# Patient Record
Sex: Male | Born: 1990 | Race: Black or African American | Hispanic: No | Marital: Single | Smoking: Current every day smoker
Health system: Southern US, Community
[De-identification: ages and names within clinical notes are randomized; demographics above are authoritative.]

## PROBLEM LIST (undated history)

## (undated) ENCOUNTER — Emergency Department (HOSPITAL_BASED_OUTPATIENT_CLINIC_OR_DEPARTMENT_OTHER): Admission: EM | Payer: Self-pay | Source: Home / Self Care

---

## 2008-02-11 ENCOUNTER — Emergency Department (HOSPITAL_BASED_OUTPATIENT_CLINIC_OR_DEPARTMENT_OTHER): Admission: EM | Admit: 2008-02-11 | Discharge: 2008-02-11 | Payer: Self-pay | Admitting: Emergency Medicine

## 2009-08-15 ENCOUNTER — Ambulatory Visit: Payer: Self-pay | Admitting: Diagnostic Radiology

## 2009-08-15 ENCOUNTER — Emergency Department (HOSPITAL_BASED_OUTPATIENT_CLINIC_OR_DEPARTMENT_OTHER): Admission: EM | Admit: 2009-08-15 | Discharge: 2009-08-15 | Payer: Self-pay | Admitting: Emergency Medicine

## 2011-03-24 ENCOUNTER — Emergency Department (HOSPITAL_BASED_OUTPATIENT_CLINIC_OR_DEPARTMENT_OTHER)
Admission: EM | Admit: 2011-03-24 | Discharge: 2011-03-24 | Discharge status: Against Medical Advice | Payer: Self-pay | Attending: Emergency Medicine | Admitting: Emergency Medicine

## 2011-03-24 ENCOUNTER — Encounter: Payer: Self-pay | Admitting: *Deleted

## 2011-03-24 DIAGNOSIS — H5710 Ocular pain, unspecified eye: Secondary | ICD-10-CM | POA: Insufficient documentation

## 2011-03-24 NOTE — ED Notes (Signed)
Right eye red and irritated since thursday

## 2011-03-24 NOTE — ED Notes (Signed)
Per registration pt was witnessed walking out of ED- did not inform staff he was leaving

## 2011-03-25 ENCOUNTER — Emergency Department (HOSPITAL_BASED_OUTPATIENT_CLINIC_OR_DEPARTMENT_OTHER)
Admission: EM | Admit: 2011-03-25 | Discharge: 2011-03-25 | Disposition: A | Discharge status: Home / Self Care | Payer: Self-pay | Attending: Emergency Medicine | Admitting: Emergency Medicine

## 2011-03-25 ENCOUNTER — Encounter (HOSPITAL_BASED_OUTPATIENT_CLINIC_OR_DEPARTMENT_OTHER): Payer: Self-pay | Admitting: Emergency Medicine

## 2011-03-25 DIAGNOSIS — J45909 Unspecified asthma, uncomplicated: Secondary | ICD-10-CM | POA: Insufficient documentation

## 2011-03-25 DIAGNOSIS — J069 Acute upper respiratory infection, unspecified: Secondary | ICD-10-CM | POA: Insufficient documentation

## 2011-03-25 DIAGNOSIS — F17200 Nicotine dependence, unspecified, uncomplicated: Secondary | ICD-10-CM | POA: Insufficient documentation

## 2011-03-25 DIAGNOSIS — H109 Unspecified conjunctivitis: Secondary | ICD-10-CM | POA: Insufficient documentation

## 2011-03-25 MED ORDER — AMOXICILLIN 500 MG PO CAPS: 500.0000 mg | ORAL_CAPSULE | Freq: Three times a day (TID) | ORAL | Status: AC

## 2011-03-25 MED ORDER — TOBRAMYCIN 0.3 % OP SOLN: 1.0000 [drp] | OPHTHALMIC | Status: AC

## 2011-03-25 MED ORDER — TETRACAINE HCL 0.5 % OP SOLN
1.0000 [drp] | Freq: Once | OPHTHALMIC | Status: DC
  Filled 2011-03-25: qty 2

## 2011-03-25 MED ORDER — FLUORESCEIN SODIUM 1 MG OP STRP
1.0000 | ORAL_STRIP | Freq: Once | OPHTHALMIC | Status: DC
  Filled 2011-03-25: qty 1

## 2011-03-25 NOTE — ED Provider Notes (Signed)
History     CSN: [id number] Arrival date & time: 03/25/2011 12:07 PM   First MD Initiated Contact with Patient 03/25/11 1213      Chief Complaint  Patient presents with  . Conjunctivitis    (Consider location/radiation/quality/duration/timing/severity/associated sxs/prior treatment) Patient is a 20 y.o. male presenting with conjunctivitis. The history is provided by the patient. A language interpreter was used.  Conjunctivitis  The current episode started yesterday. The problem occurs continuously. The problem has been gradually worsening. The problem is moderate. The symptoms are relieved by nothing. The symptoms are aggravated by nothing. Associated symptoms include decreased vision, double vision, eye itching, eye discharge and eye redness. Pertinent negatives include no muscle aches, no cough and no URI. The eye pain is moderate. The eye pain is associated with movement. The eyelid exhibits swelling.   Pt complains of a cough congestion and nasal drainage Past Medical History  Diagnosis Date  . Asthma     History reviewed. No pertinent past surgical history.  History reviewed. No pertinent family history.  History  Substance Use Topics  . Smoking status: Current Everyday Smoker    Types: Cigars  . Smokeless tobacco: Never Used  . Alcohol Use: 7.2 oz/week    12 Shots of liquor per week     occasional      Review of Systems  Eyes: Positive for double vision, discharge, redness and itching.  Respiratory: Negative for cough.   All other systems reviewed and are negative.    Allergies  Review of patient's allergies indicates no known allergies.  Home Medications  No current outpatient prescriptions on file.  BP 144/80  Pulse 76  Temp(Src) 98.6 F (37 C) (Oral)  Resp 18  Ht 6' (1.829 m)  Wt 172 lb (78.019 kg)  BMI 23.33 kg/m2  SpO2 99%  Physical Exam  Nursing note and vitals reviewed. Constitutional: He appears well-developed and well-nourished.  HENT:   Head: Normocephalic and atraumatic.  Right Ear: External ear normal.  Left Ear: External ear normal.  Eyes: Conjunctivae and EOM are normal. Pupils are equal, round, and reactive to light. Right eye exhibits discharge.  Neck: Normal range of motion. Neck supple.  Cardiovascular: Normal rate.   Pulmonary/Chest: Effort normal.  Abdominal: Soft.  Musculoskeletal: Normal range of motion.  Neurological: He is alert.  Skin: Skin is warm.  Psychiatric: He has a normal mood and affect.    ED Course  Procedures (including critical care time)  Labs Reviewed - No data to display No results found.   No diagnosis found.    MDM   Pt counseled on eye infections,  I will treat with amoxicillian and tobrex       Langston Masker, [place] 03/25/11 1257

## 2011-03-25 NOTE — ED Provider Notes (Signed)
Medical screening examination/treatment/procedure(s) were performed by non-physician practitioner and as supervising physician I was immediately available for consultation/collaboration.  Llewellyn Choplin K Linker, MD 03/25/11 1259 

## 2011-03-25 NOTE — ED Notes (Signed)
Right eye redness, drainage, pain x 3 days.  Pt having crust on eye in am.

## 2011-10-05 ENCOUNTER — Emergency Department (HOSPITAL_BASED_OUTPATIENT_CLINIC_OR_DEPARTMENT_OTHER)
Admission: EM | Admit: 2011-10-05 | Discharge: 2011-10-05 | Disposition: A | Discharge status: Home / Self Care | Payer: Self-pay | Attending: Emergency Medicine | Admitting: Emergency Medicine

## 2011-10-05 ENCOUNTER — Encounter (HOSPITAL_BASED_OUTPATIENT_CLINIC_OR_DEPARTMENT_OTHER): Payer: Self-pay | Admitting: *Deleted

## 2011-10-05 DIAGNOSIS — Z79899 Other long term (current) drug therapy: Secondary | ICD-10-CM | POA: Insufficient documentation

## 2011-10-05 DIAGNOSIS — J45909 Unspecified asthma, uncomplicated: Secondary | ICD-10-CM | POA: Insufficient documentation

## 2011-10-05 DIAGNOSIS — R509 Fever, unspecified: Secondary | ICD-10-CM | POA: Insufficient documentation

## 2011-10-05 DIAGNOSIS — R109 Unspecified abdominal pain: Secondary | ICD-10-CM | POA: Insufficient documentation

## 2011-10-05 DIAGNOSIS — R10817 Generalized abdominal tenderness: Secondary | ICD-10-CM | POA: Insufficient documentation

## 2011-10-05 DIAGNOSIS — F17200 Nicotine dependence, unspecified, uncomplicated: Secondary | ICD-10-CM | POA: Insufficient documentation

## 2011-10-05 DIAGNOSIS — R197 Diarrhea, unspecified: Secondary | ICD-10-CM | POA: Insufficient documentation

## 2011-10-05 DIAGNOSIS — R5381 Other malaise: Secondary | ICD-10-CM | POA: Insufficient documentation

## 2011-10-05 DIAGNOSIS — R112 Nausea with vomiting, unspecified: Secondary | ICD-10-CM

## 2011-10-05 LAB — BASIC METABOLIC PANEL WITH GFR
Creatinine, Ser: 1.1 mg/dL (ref 0.50–1.35)
Glucose, Bld: 83 mg/dL (ref 70–99)
Potassium: 3.6 meq/L (ref 3.5–5.1)

## 2011-10-05 LAB — BASIC METABOLIC PANEL
BUN: 13 mg/dL (ref 6–23)
CO2: 23 mEq/L (ref 19–32)
Calcium: 9.6 mg/dL (ref 8.4–10.5)
Chloride: 101 mEq/L (ref 96–112)
GFR calc Af Amer: >90 mL/min (ref >90)
GFR calc non Af Amer: >90 mL/min (ref >90)
Sodium: 139 mEq/L (ref 135–145)

## 2011-10-05 MED ORDER — SODIUM CHLORIDE 0.9 % IV BOLUS (SEPSIS)
1000.0000 mL | Freq: Once | INTRAVENOUS | Status: AC
  Administered 2011-10-05: 1000 mL via INTRAVENOUS

## 2011-10-05 MED ORDER — PROMETHAZINE HCL 25 MG PO TABS: 25.0000 mg | ORAL_TABLET | Freq: Four times a day (QID) | ORAL | Status: DC | PRN

## 2011-10-05 MED ORDER — FENTANYL CITRATE 0.05 MG/ML IJ SOLN
50.0000 ug | Freq: Once | INTRAMUSCULAR | Status: AC
  Administered 2011-10-05: 50 ug via INTRAVENOUS
  Filled 2011-10-05: qty 2

## 2011-10-05 MED ORDER — FAMOTIDINE IN NACL 20-0.9 MG/50ML-% IV SOLN
20.0000 mg | Freq: Once | INTRAVENOUS | Status: AC
  Administered 2011-10-05: 20 mg via INTRAVENOUS
  Filled 2011-10-05: qty 50

## 2011-10-05 MED ORDER — ONDANSETRON HCL 4 MG/2ML IJ SOLN
4.0000 mg | Freq: Once | INTRAMUSCULAR | Status: AC
  Administered 2011-10-05: 4 mg via INTRAVENOUS
  Filled 2011-10-05: qty 2

## 2011-10-05 MED ORDER — ONDANSETRON 8 MG PO TBDP: 8.0000 mg | ORAL_TABLET | Freq: Two times a day (BID) | ORAL | Status: AC | PRN

## 2011-10-05 NOTE — ED Notes (Signed)
Reports feeling weak and generally bad on Wednesday afternoon woke up thurs am vomited "greenish acid" ate subway at lunch kept down for 2 hours then vomited again x 2 last emesis last pm at 2200 started having diarrhea last night and reports liquid stools off and on since last pm is able to keep water down

## 2011-10-05 NOTE — ED Provider Notes (Signed)
History     CSN: [id number]  Arrival date & time 10/05/11  4098   First MD Initiated Contact with Patient 10/05/11 [phone]      Chief Complaint  Patient presents with  . Diarrhea    (Consider location/radiation/quality/duration/timing/severity/associated sxs/prior treatment) HPI Comments: Pt reports was very fatigued and had myalgias 2 days ago.  Slept all afternoon, got up had 1 episode of vomiting, continued to be tired with decreased appetite. He tried to eat some subway today, finally, but  got sick and had  abd crampy pain in middle of abdomen, non radiating.  No back pain, no rectal bleeding.  No sig PMH x asthma but not SOB, no coughing, no prior h/o abd surgeries.  Didn't take any meds PTA.  No bloody stools, no foreign travel or recent abx use.    Patient is a 21 y.o. male presenting with diarrhea. The history is provided by the patient.  Diarrhea The primary symptoms include fever, fatigue, abdominal pain, nausea, vomiting and diarrhea.  The illness is also significant for chills. The illness does not include constipation or back pain.    Past Medical History  Diagnosis Date  . Asthma     History reviewed. No pertinent past surgical history.  History reviewed. No pertinent family history.  History  Substance Use Topics  . Smoking status: Current Everyday Smoker    Types: Cigars  . Smokeless tobacco: Never Used  . Alcohol Use: 7.2 oz/week    12 Shots of liquor per week     occasional      Review of Systems  Constitutional: Positive for fever, chills, appetite change and fatigue.  Gastrointestinal: Positive for nausea, vomiting, abdominal pain and diarrhea. Negative for constipation, blood in stool and rectal pain.  Musculoskeletal: Negative for back pain.  Neurological: Positive for weakness. Negative for light-headedness, numbness and headaches.  All other systems reviewed and are negative.    Allergies  Review of patient's allergies indicates no known  allergies.  Home Medications   Current Outpatient Rx  Name Route Sig Dispense Refill  . IPRATROPIUM-ALBUTEROL 18-103 MCG/ACT IN AERO Inhalation Inhale 2 puffs into the lungs every 6 (six) hours as needed.    Marland Kitchen ONDANSETRON 8 MG PO TBDP Oral Take 1 tablet (8 mg total) by mouth every 12 (twelve) hours as needed for nausea. 20 tablet 0  . PROMETHAZINE HCL 25 MG PO TABS Oral Take 1 tablet (25 mg total) by mouth every 6 (six) hours as needed for nausea. 20 tablet 0    BP 141/71  Pulse 83  Temp(Src) 98.2 F (36.8 C) (Oral)  Resp 18  Ht 6' (1.829 m)  Wt 173 lb (78.472 kg)  BMI 23.46 kg/m2  SpO2 100%  Physical Exam  Nursing note and vitals reviewed. Constitutional: He appears well-developed and well-nourished. No distress.  HENT:  Head: Normocephalic.  Mouth/Throat: Uvula is midline. Mucous membranes are not pale, dry and not cyanotic.  Eyes: No scleral icterus.  Cardiovascular: Normal rate.   Abdominal: Soft. Normal appearance. He exhibits no distension. There is generalized tenderness. There is no rebound, no guarding, no CVA tenderness, no tenderness at McBurney's point and negative Murphy's sign.  Musculoskeletal: He exhibits no edema and no tenderness.  Neurological: He is alert. Coordination normal.  Skin: Skin is warm and dry. He is not diaphoretic.  Psychiatric: He has a normal mood and affect.    ED Course  Procedures (including critical care time)   Labs Reviewed  BASIC METABOLIC PANEL  No results found.   1. Nausea vomiting and diarrhea       MDM  Pt felt much improved after IVF's, and IV meds, now tolerating oral fluids.  Pt safe to be discharged, probably mild gastroenteritis.  Pt told to try imodium at home.          Gavin Pound. Natale Thoma, MD 10/05/11 4098

## 2011-10-05 NOTE — Discharge Instructions (Signed)
Nausea and Vomiting  Nausea is a sick feeling that often comes before throwing up (vomiting). Vomiting is a reflex where stomach contents come out of your mouth. Vomiting can cause severe loss of body fluids (dehydration). Children and elderly adults can become dehydrated quickly, especially if they also have diarrhea. Nausea and vomiting are symptoms of a condition or disease. It is important to find the cause of your symptoms.  CAUSES    Direct irritation of the stomach lining. This irritation can result from increased acid production (gastroesophageal reflux disease), infection, food poisoning, taking certain medicines (such as nonsteroidal anti-inflammatory drugs), alcohol use, or tobacco use.   Signals from the brain.These signals could be caused by a headache, heat exposure, an inner ear disturbance, increased pressure in the brain from injury, infection, a tumor, or a concussion, pain, emotional stimulus, or metabolic problems.   An obstruction in the gastrointestinal tract (bowel obstruction).   Illnesses such as diabetes, hepatitis, gallbladder problems, appendicitis, kidney problems, cancer, sepsis, atypical symptoms of a heart attack, or eating disorders.   Medical treatments such as chemotherapy and radiation.   Receiving medicine that makes you sleep (general anesthetic) during surgery.  DIAGNOSIS  Your caregiver may ask for tests to be done if the problems do not improve after a few days. Tests may also be done if symptoms are severe or if the reason for the nausea and vomiting is not clear. Tests may include:   Urine tests.   Blood tests.   Stool tests.   Cultures (to look for evidence of infection).   X-rays or other imaging studies.  Test results can help your caregiver make decisions about treatment or the need for additional tests.  TREATMENT  You need to stay well hydrated. Drink frequently but in small amounts.You may wish to drink water, sports drinks, clear broth, or eat frozen  ice pops or gelatin dessert to help stay hydrated.When you eat, eating slowly may help prevent nausea.There are also some antinausea medicines that may help prevent nausea.  HOME CARE INSTRUCTIONS    Take all medicine as directed by your caregiver.   If you do not have an appetite, do not force yourself to eat. However, you must continue to drink fluids.   If you have an appetite, eat a normal diet unless your caregiver tells you differently.   Eat a variety of complex carbohydrates (rice, wheat, potatoes, bread), lean meats, yogurt, fruits, and vegetables.   Avoid high-fat foods because they are more difficult to digest.   Drink enough water and fluids to keep your urine clear or pale yellow.   If you are dehydrated, ask your caregiver for specific rehydration instructions. Signs of dehydration may include:   Severe thirst.   Dry lips and mouth.   Dizziness.   Dark urine.   Decreasing urine frequency and amount.   Confusion.   Rapid breathing or pulse.  SEEK IMMEDIATE MEDICAL CARE IF:    You have blood or brown flecks (like coffee grounds) in your vomit.   You have black or bloody stools.   You have a severe headache or stiff neck.   You are confused.   You have severe abdominal pain.   You have chest pain or trouble breathing.   You do not urinate at least once every 8 hours.   You develop cold or clammy skin.   You continue to vomit for longer than 24 to 48 hours.   You have a fever.  MAKE SURE YOU:      Understand these instructions.   Will watch your condition.   Will get help right away if you are not doing well or get worse.  Document Released: 05/14/2005 Document Revised: 05/03/2011 Document Reviewed: 10/11/2010  [employer] Patient Information 2012 [employer].

## 2011-10-05 NOTE — ED Notes (Signed)
Patient is resting comfortably. 

## 2011-10-05 NOTE — ED Notes (Signed)
MD at bedside. 

## 2013-07-30 ENCOUNTER — Encounter (HOSPITAL_BASED_OUTPATIENT_CLINIC_OR_DEPARTMENT_OTHER): Payer: Self-pay | Admitting: Emergency Medicine

## 2013-07-30 ENCOUNTER — Emergency Department (HOSPITAL_BASED_OUTPATIENT_CLINIC_OR_DEPARTMENT_OTHER)
Admission: EM | Admit: 2013-07-30 | Discharge: 2013-07-30 | Disposition: A | Discharge status: Home / Self Care | Payer: Self-pay | Attending: Emergency Medicine | Admitting: Emergency Medicine

## 2013-07-30 DIAGNOSIS — R1030 Lower abdominal pain, unspecified: Secondary | ICD-10-CM

## 2013-07-30 DIAGNOSIS — R109 Unspecified abdominal pain: Secondary | ICD-10-CM | POA: Insufficient documentation

## 2013-07-30 DIAGNOSIS — Z79899 Other long term (current) drug therapy: Secondary | ICD-10-CM | POA: Insufficient documentation

## 2013-07-30 DIAGNOSIS — F17200 Nicotine dependence, unspecified, uncomplicated: Secondary | ICD-10-CM | POA: Insufficient documentation

## 2013-07-30 DIAGNOSIS — J45909 Unspecified asthma, uncomplicated: Secondary | ICD-10-CM | POA: Insufficient documentation

## 2013-07-30 DIAGNOSIS — L989 Disorder of the skin and subcutaneous tissue, unspecified: Secondary | ICD-10-CM | POA: Insufficient documentation

## 2013-07-30 LAB — URINALYSIS, ROUTINE W REFLEX MICROSCOPIC
BILIRUBIN URINE: NEGATIVE
GLUCOSE, UA: NEGATIVE mg/dL
HGB URINE DIPSTICK: NEGATIVE
KETONES UR: NEGATIVE mg/dL
LEUKOCYTES UA: NEGATIVE
NITRITE: NEGATIVE
PROTEIN: NEGATIVE mg/dL
Specific Gravity, Urine: 1.026 (ref 1.005–1.030)
Urobilinogen, UA: 1 mg/dL (ref 0.0–1.0)
pH: 6.5 (ref 5.0–8.0)

## 2013-07-30 NOTE — ED Provider Notes (Signed)
CSN: [id number]     Arrival date & time 07/30/13  0807 History   First MD Initiated Contact with Patient 07/30/13 [phone]     Chief Complaint  Patient presents with  . Groin Swelling    right     (Consider location/radiation/quality/duration/timing/severity/associated sxs/prior Treatment) HPI Patient presents with concern over a red, painful lesion in the right lateral groin.  Lesion became presents with 3 days ago, but painful today.  Patient is focally in the right medial proximal thigh below the inguinal crease. Pain is sore, mild, worse with ambulation. Patient does not shave, but did shave today to visualize the wound.   He denies concurrent abdominal pain, dysuria, hematuria, penile or scrotal swelling. He denies any other complaints. Patient states that he has had 1 similar episode in the groin as well as one in the right axilla, neither required incision and drainage.   Past Medical History  Diagnosis Date  . Asthma    History reviewed. No pertinent past surgical history. No family history on file. History  Substance Use Topics  . Smoking status: Current Every Day Smoker    Types: Cigars  . Smokeless tobacco: Never Used  . Alcohol Use: 7.2 oz/week    12 Shots of liquor per week     Comment: occasional    Review of Systems  All other systems reviewed and are negative.      Allergies  Review of patient's allergies indicates no known allergies.  Home Medications   Current Outpatient Rx  Name  Route  Sig  Dispense  Refill  . albuterol-ipratropium (COMBIVENT) 18-103 MCG/ACT inhaler   Inhalation   Inhale 2 puffs into the lungs every 6 (six) hours as needed.         Marland Kitchen. EXPIRED: promethazine (PHENERGAN) 25 MG tablet   Oral   Take 1 tablet (25 mg total) by mouth every 6 (six) hours as needed for nausea.   20 tablet   0    BP 151/96  Pulse 70  Temp(Src) 97.9 F (36.6 C) (Oral)  Resp 16  Ht 6' (1.829 m)  Wt 184 lb (83.462 kg)  BMI 24.95 kg/m2  SpO2  98% Physical Exam  Vitals reviewed. Constitutional: He appears well-developed and well-nourished. No distress.  HENT:  Head: Normocephalic and atraumatic.  Eyes: Conjunctivae are normal. Right eye exhibits no discharge. Left eye exhibits no discharge.  Neck: No tracheal deviation present.  Pulmonary/Chest: No stridor. No respiratory distress.  Abdominal: Soft. He exhibits no distension.  Genitourinary:     Skin: Skin is warm and dry. No rash noted. He is not diaphoretic.    ED Course  Procedures (including critical care time) Labs Review Labs Reviewed  URINALYSIS, ROUTINE W REFLEX MICROSCOPIC    MDM   Final diagnoses:  Groin pain    This generally well young male presents with several days of concern for a lesion in the right lateral groin.  The patient's denial of any other complaints his reassuring for the low suspicion of systemic infection or pathology.  Patient's physical exam demonstrates an indurated area with no focal area of fluctuance, with nothing amenable to incision currently.  Patient was provided detailed instructions on home care, return precautions, discharged in stable condition.    Gerhard Munchobert Puneet Masoner, MD 07/30/13 [phone]

## 2013-07-30 NOTE — Discharge Instructions (Signed)
As discussed, your groin and wound is likely due to minor infection of one of the hair follicles.  Please use warm compresses, 4/5 times daily.  Please use Sitz bathes two times daily as well.  If your wound progresses, or you develop new, or concerning changes in your condition, please be sure to return here for further E/M.

## 2013-07-30 NOTE — ED Notes (Signed)
Patient states he has a "lymp node" that is swollen in the right groin area for the last four days.  States this morning he woke up with a lot of pain.

## 2015-11-25 ENCOUNTER — Emergency Department (HOSPITAL_BASED_OUTPATIENT_CLINIC_OR_DEPARTMENT_OTHER): Payer: Self-pay

## 2015-11-25 ENCOUNTER — Encounter (HOSPITAL_BASED_OUTPATIENT_CLINIC_OR_DEPARTMENT_OTHER): Payer: Self-pay | Admitting: *Deleted

## 2015-11-25 ENCOUNTER — Emergency Department (HOSPITAL_BASED_OUTPATIENT_CLINIC_OR_DEPARTMENT_OTHER)
Admission: EM | Admit: 2015-11-25 | Discharge: 2015-11-25 | Disposition: A | Discharge status: Home / Self Care | Payer: Self-pay | Attending: Emergency Medicine | Admitting: Emergency Medicine

## 2015-11-25 DIAGNOSIS — M5410 Radiculopathy, site unspecified: Secondary | ICD-10-CM

## 2015-11-25 DIAGNOSIS — F17210 Nicotine dependence, cigarettes, uncomplicated: Secondary | ICD-10-CM | POA: Insufficient documentation

## 2015-11-25 DIAGNOSIS — M5416 Radiculopathy, lumbar region: Secondary | ICD-10-CM | POA: Insufficient documentation

## 2015-11-25 DIAGNOSIS — J45909 Unspecified asthma, uncomplicated: Secondary | ICD-10-CM | POA: Insufficient documentation

## 2015-11-25 MED ORDER — TRAMADOL HCL 50 MG PO TABS: 50.0000 mg | ORAL_TABLET | Freq: Four times a day (QID) | ORAL | Status: DC | PRN

## 2015-11-25 MED ORDER — CYCLOBENZAPRINE HCL 10 MG PO TABS: 10.0000 mg | ORAL_TABLET | Freq: Two times a day (BID) | ORAL | Status: DC | PRN

## 2015-11-25 MED ORDER — PREDNISONE 20 MG PO TABS: ORAL_TABLET | ORAL | Status: DC

## 2015-11-25 NOTE — ED Provider Notes (Signed)
CSN: [id number]     Arrival date & time 11/25/15  1309 History   First MD Initiated Contact with Patient 11/25/15 1318     Chief Complaint  Patient presents with  . Back Pain     (Consider location/radiation/quality/duration/timing/severity/associated sxs/prior Treatment) HPI   25 year old male with history of asthma presenting with complaint of back pain. Patient states he has had chronic back pain since he was a teenager. States he "just deal with it" however for the past 6 months he has had worsening pain from his low back radiates to his left hip and down to his left thigh. Pain initially was intermittent and now has been persistent. He described the pain as sharp throbbing pain worsening with walking and with movement and improves when he lies flat. Pain not adequately improved despite trying to stretch and take Tylenol at home. Pain is currently moderate to severe prompting patient to come here for evaluation. Patient states he was incarcerated last year and at that time when he has similar pain he was told that it may be a pinched nerve. 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 He denies fever, chills, bowel bladder incontinence or saddle anesthesia. Denies abdominal pain, dysuria or hematuria. Denies any numbness to his leg. No history of IV drug use or active cancer.  Past Medical History  Diagnosis Date  . Asthma    History reviewed. No pertinent past surgical history. No family history on file. Social History  Substance Use Topics  . Smoking status: Current Every Day Smoker    Types: Cigars  . Smokeless tobacco: Never Used  . Alcohol Use: 7.2 oz/week    12 Shots of liquor per week     Comment: occasional    Review of Systems  Constitutional: Negative for fever.  Musculoskeletal: Positive for back pain.  Neurological: Negative for numbness.      Allergies  Review of patient's allergies indicates no  known allergies.  Home Medications   Prior to Admission medications   Medication Sig Start Date End Date Taking? Authorizing Provider  albuterol-ipratropium (COMBIVENT) 18-103 MCG/ACT inhaler Inhale 2 puffs into the lungs every 6 (six) hours as needed.    Historical Provider, MD  promethazine (PHENERGAN) 25 MG tablet Take 1 tablet (25 mg total) by mouth every 6 (six) hours as needed for nausea. 10/05/11 10/12/11  Quita SkyeMichael Ghim, MD   BP 131/83 mmHg  Pulse 88  Temp(Src) 98.2 F (36.8 C) (Oral)  Resp 18  Ht 6' (1.829 m)  Wt 90.719 kg  BMI 27.12 kg/m2  SpO2 97% Physical Exam  Constitutional: He appears well-developed and well-nourished. No distress.  HENT:  Head: Atraumatic.  Eyes: Conjunctivae are normal.  Neck: Neck supple.  Cardiovascular: Normal rate, regular rhythm and intact distal pulses.   Pulmonary/Chest: Effort normal and breath sounds normal.  Abdominal: Soft.  Musculoskeletal: He exhibits tenderness (Mild tenderness to left paralumbar spinal muscle on palpation. Left hip with normal flexion extension abduction and abduction. Patellar deep tendon reflex intact).  Neurological: He is alert.  5 out 5 strength to bilateral lower extremities. No foot drop. Intact deep tendon reflex  Skin: No rash noted.  Psychiatric: He has a normal mood and affect.  Nursing note and vitals reviewed.   ED Course  Procedures (including critical care time) Labs Review Labs Reviewed - No data to display  Imaging Review Dg Hip Unilat With Pelvis 2-3 Views Left  11/25/2015  CLINICAL DATA:  Left leg pain for 6 months.  No reported injury. EXAM: DG HIP (WITH OR WITHOUT PELVIS) 2-3V LEFT COMPARISON:  None. FINDINGS: There is no evidence of hip fracture or dislocation. There is no evidence of arthropathy or other focal bone abnormality. IMPRESSION: Normal left hip. Electronically Signed   By: Lupita RaiderJames  Green Jr, M.D.   On: 11/25/2015 14:51   I have personally reviewed and evaluated these images and  lab results as part of my medical decision-making.   EKG Interpretation None      MDM   Final diagnoses:  Radicular leg pain    BP 131/83 mmHg  Pulse 88  Temp(Src) 98.2 F (36.8 C) (Oral)  Resp 18  Ht 6' (1.829 m)  Wt 90.719 kg  BMI 27.12 kg/m2  SpO2 97%   2:22 PM Patient with chronic low back pain and now chronic left hip pain for more than 6 months. Given the duration of his symptoms and no prior evaluation, I will obtain a screening x-ray. Patient otherwise neurovascularly intact without any red flags. He is able to ambulate.  3:15 PM X-ray of left hip is unremarkable. Patient is reassured. Since patient had radicular pain, I will provide a short course of steroids, muscle relaxant, and pain medication. Encouraged patient to follow-up with orthopedist for further care. Return percussion discussed. Patient is stable for discharge.  Fayrene HelperBowie Luretha Eberly, PA-C 11/25/15 1519  Laurence Spatesachel Morgan Little, MD 11/25/15 [phone]

## 2015-11-25 NOTE — Discharge Instructions (Signed)
Radicular Pain °Radicular pain in either the arm or leg is usually from a bulging or herniated disk in the spine. A piece of the herniated disk may press against the nerves as the nerves exit the spine. This causes pain which is felt at the tips of the nerves down the arm or leg. Other causes of radicular pain may include: °· Fractures. °· Heart disease. °· Cancer. °· An abnormal and usually degenerative state of the nervous system or nerves (neuropathy). °Diagnosis may require CT or MRI scanning to determine the primary cause.  °Nerves that start at the neck (nerve roots) may cause radicular pain in the outer shoulder and arm. It can spread down to the thumb and fingers. The symptoms vary depending on which nerve root has been affected. In most cases radicular pain improves with conservative treatment. Neck problems may require physical therapy, a neck collar, or cervical traction. Treatment may take many weeks, and surgery may be considered if the symptoms do not improve.  °Conservative treatment is also recommended for sciatica. Sciatica causes pain to radiate from the lower back or buttock area down the leg into the foot. Often there is a history of back problems. Most patients with sciatica are better after 2 to 4 weeks of rest and other supportive care. Short term bed rest can reduce the disk pressure considerably. Sitting, however, is not a good position since this increases the pressure on the disk. You should avoid bending, lifting, and all other activities which make the problem worse. Traction can be used in severe cases. Surgery is usually reserved for patients who do not improve within the first months of treatment. °Only take over-the-counter or prescription medicines for pain, discomfort, or fever as directed by your caregiver. Narcotics and muscle relaxants may help by relieving more severe pain and spasm and by providing mild sedation. Cold or massage can give significant relief. Spinal manipulation  is not recommended. It can increase the degree of disc protrusion. Epidural steroid injections are often effective treatment for radicular pain. These injections deliver medicine to the spinal nerve in the space between the protective covering of the spinal cord and back bones (vertebrae). Your caregiver can give you more information about steroid injections. These injections are most effective when given within two weeks of the onset of pain.  °You should see your caregiver for follow up care as recommended. A program for neck and back injury rehabilitation with stretching and strengthening exercises is an important part of management.  °SEEK IMMEDIATE MEDICAL CARE IF: °· You develop increased pain, weakness, or numbness in your arm or leg. °· You develop difficulty with bladder or bowel control. °· You develop abdominal pain. °  °This information is not intended to replace advice given to you by your health care provider. Make sure you discuss any questions you have with your health care provider. °  °Document Released: 06/21/2004 Document Revised: 06/04/2014 Document Reviewed: 12/08/2014 °Elsevier Interactive Patient Education ©2016 [employer] ° °

## 2015-11-25 NOTE — ED Notes (Signed)
Pt reports lower back pain x 1 year, hasn't had it looked at. Reports radiation down left leg.

## 2015-11-25 NOTE — ED Notes (Signed)
Chronic back pain for a year. For the past 6 months pain radiates down his left leg.

## 2016-09-08 ENCOUNTER — Emergency Department (HOSPITAL_BASED_OUTPATIENT_CLINIC_OR_DEPARTMENT_OTHER): Payer: Self-pay

## 2016-09-08 ENCOUNTER — Emergency Department (HOSPITAL_BASED_OUTPATIENT_CLINIC_OR_DEPARTMENT_OTHER)
Admission: EM | Admit: 2016-09-08 | Discharge: 2016-09-08 | Disposition: A | Discharge status: Home / Self Care | Payer: Self-pay | Attending: Emergency Medicine | Admitting: Emergency Medicine

## 2016-09-08 ENCOUNTER — Emergency Department (HOSPITAL_BASED_OUTPATIENT_CLINIC_OR_DEPARTMENT_OTHER)
Admission: EM | Admit: 2016-09-08 | Discharge: 2016-09-08 | Disposition: A | Discharge status: Home / Self Care | Payer: Self-pay | Attending: Dermatology | Admitting: Dermatology

## 2016-09-08 ENCOUNTER — Encounter (HOSPITAL_BASED_OUTPATIENT_CLINIC_OR_DEPARTMENT_OTHER): Payer: Self-pay | Admitting: *Deleted

## 2016-09-08 ENCOUNTER — Encounter (HOSPITAL_BASED_OUTPATIENT_CLINIC_OR_DEPARTMENT_OTHER): Payer: Self-pay | Admitting: Emergency Medicine

## 2016-09-08 DIAGNOSIS — Z79899 Other long term (current) drug therapy: Secondary | ICD-10-CM | POA: Insufficient documentation

## 2016-09-08 DIAGNOSIS — Y939 Activity, unspecified: Secondary | ICD-10-CM | POA: Insufficient documentation

## 2016-09-08 DIAGNOSIS — S92411A Displaced fracture of proximal phalanx of right great toe, initial encounter for closed fracture: Secondary | ICD-10-CM | POA: Insufficient documentation

## 2016-09-08 DIAGNOSIS — S99921A Unspecified injury of right foot, initial encounter: Secondary | ICD-10-CM | POA: Insufficient documentation

## 2016-09-08 DIAGNOSIS — J45909 Unspecified asthma, uncomplicated: Secondary | ICD-10-CM | POA: Insufficient documentation

## 2016-09-08 DIAGNOSIS — W2209XA Striking against other stationary object, initial encounter: Secondary | ICD-10-CM | POA: Insufficient documentation

## 2016-09-08 DIAGNOSIS — Y9301 Activity, walking, marching and hiking: Secondary | ICD-10-CM | POA: Insufficient documentation

## 2016-09-08 DIAGNOSIS — Y999 Unspecified external cause status: Secondary | ICD-10-CM | POA: Insufficient documentation

## 2016-09-08 DIAGNOSIS — Y929 Unspecified place or not applicable: Secondary | ICD-10-CM | POA: Insufficient documentation

## 2016-09-08 DIAGNOSIS — F1290 Cannabis use, unspecified, uncomplicated: Secondary | ICD-10-CM | POA: Insufficient documentation

## 2016-09-08 DIAGNOSIS — F17290 Nicotine dependence, other tobacco product, uncomplicated: Secondary | ICD-10-CM | POA: Insufficient documentation

## 2016-09-08 DIAGNOSIS — Z5321 Procedure and treatment not carried out due to patient leaving prior to being seen by health care provider: Secondary | ICD-10-CM | POA: Insufficient documentation

## 2016-09-08 MED ORDER — NAPROXEN 500 MG PO TABS: 500.0000 mg | ORAL_TABLET | Freq: Two times a day (BID) | ORAL | 0 refills | Status: DC

## 2016-09-08 MED ORDER — IBUPROFEN 400 MG PO TABS
400.0000 mg | ORAL_TABLET | Freq: Once | ORAL | Status: AC
  Administered 2016-09-08: 400 mg via ORAL
  Filled 2016-09-08: qty 1

## 2016-09-08 NOTE — ED Notes (Signed)
Pt noted to be walking around the lobby, no difficulty.

## 2016-09-08 NOTE — ED Triage Notes (Addendum)
R big toe pain after kicking a door today. Pt was here earlier but LWBS to go to [facility]. Pt presents in triage eating dinner.

## 2016-09-08 NOTE — ED Notes (Signed)
PMS intact before and after. Pt tolerated well. All questions answered. 

## 2016-09-08 NOTE — Discharge Instructions (Signed)
Use the crutches and postoperative shoe, take Naprosyn as needed for pain, apply ice and try to keep her foot elevated, call Dr. Lazaro Arms office to arrange a follow-up appointment

## 2016-09-08 NOTE — ED Notes (Signed)
Pt left the ED

## 2016-09-08 NOTE — ED Provider Notes (Signed)
[facility] Provider Note   CSN: [id number] Arrival date & time: 09/08/16  1839  By signing my name below, I, Modena Jansky, attest that this documentation has been prepared under the direction and in the presence of Linwood Dibbles, MD. Electronically Signed: Modena Jansky, Scribe. 09/08/2016. 8:16 PM.  History   Chief Complaint Chief Complaint  Patient presents with  . Toe Injury   The history is provided by the patient. No language interpreter was used.   HPI Comments: Frank Poole is a 26 y.o. male who presents to the Emergency Department complaining of constant moderate right great toe pain that started today. He states he kicked a door today and hurt his toe. He came to the same ED earlier today and left without being seen. His pain is exacerbated by toe movement. Denies any other complaints at this time.  Past Medical History:  Diagnosis Date  . Asthma     There are no active problems to display for this patient.   History reviewed. No pertinent surgical history.     Home Medications    Prior to Admission medications   Medication Sig Start Date End Date Taking? Authorizing Provider  albuterol-ipratropium (COMBIVENT) 18-103 MCG/ACT inhaler Inhale 2 puffs into the lungs every 6 (six) hours as needed.    Historical Provider, MD  cyclobenzaprine (FLEXERIL) 10 MG tablet Take 1 tablet (10 mg total) by mouth 2 (two) times daily as needed for muscle spasms. 11/25/15   Fayrene Helper, PA-C  naproxen (NAPROSYN) 500 MG tablet Take 1 tablet (500 mg total) by mouth 2 (two) times daily. 09/08/16   Linwood Dibbles, MD  predniSONE (DELTASONE) 20 MG tablet 3 tabs po day one, then 2 tabs daily x 4 days 11/25/15   Fayrene Helper, PA-C  promethazine (PHENERGAN) 25 MG tablet Take 1 tablet (25 mg total) by mouth every 6 (six) hours as needed for nausea. 10/05/11 10/12/11  Quita Skye, MD  traMADol (ULTRAM) 50 MG tablet Take 1 tablet (50 mg total) by mouth every 6 (six) hours as needed for moderate  pain. 11/25/15   Fayrene Helper, PA-C    Family History No family history on file.  Social History Social History  Substance Use Topics  . Smoking status: Current Every Day Smoker    Types: Cigars  . Smokeless tobacco: Never Used  . Alcohol use No     Allergies   Banana   Review of Systems Review of Systems  Constitutional: Negative for fever.  Respiratory: Negative for shortness of breath.   Musculoskeletal: Positive for myalgias (Right great toe).     Physical Exam Updated Vital Signs BP (!) 148/92 (BP Location: Left Arm)   Pulse 84   Temp 98.1 F (36.7 C)   Resp 20   SpO2 100%   Physical Exam  Constitutional: He appears well-developed and well-nourished. No distress.  HENT:  Head: Normocephalic and atraumatic.  Right Ear: External ear normal.  Left Ear: External ear normal.  Eyes: Conjunctivae are normal. Right eye exhibits no discharge. Left eye exhibits no discharge. No scleral icterus.  Neck: Neck supple. No tracheal deviation present.  Cardiovascular: Normal rate.   Pulmonary/Chest: Effort normal. No stridor. No respiratory distress.  Abdominal: He exhibits no distension.  Musculoskeletal: He exhibits no edema.  Neurological: He is alert. Cranial nerve deficit: no gross deficits.  Skin: Skin is warm and dry. No rash noted.  Psychiatric: He has a normal mood and affect.  Nursing note and vitals reviewed.  ED Treatments / Results  DIAGNOSTIC STUDIES: Oxygen Saturation is 100% on RA, normal by my interpretation.    COORDINATION OF CARE: 8:20 PM- Pt advised of plan for treatment and pt agrees.  Labs (all labs ordered are listed, but only abnormal results are displayed) Labs Reviewed - No data to display  EKG  EKG Interpretation None       Radiology Dg Toe Great Right  Result Date: 09/08/2016 CLINICAL DATA:  Acute right great toe pain following injury today. Initial encounter. EXAM: RIGHT GREAT TOE COMPARISON:  None. FINDINGS: A nondisplaced  fracture of the proximal phalanx is noted extending from the MTP joint to the interphalangeal joint. No subluxation or dislocation noted. IMPRESSION: Nondisplaced fracture of the proximal phalanx as described. Electronically Signed   By: Harmon Pier M.D.   On: 09/08/2016 15:59    Procedures Procedures (including critical care time)  Medications Ordered in ED Medications  ibuprofen (ADVIL,MOTRIN) tablet 400 mg (400 mg Oral Given 09/08/16 1925)     Initial Impression / Assessment and Plan / ED Course  I have reviewed the triage vital signs and the nursing notes.  Pertinent labs & imaging results that were available during my care of the patient were reviewed by me and considered in my medical decision making (see chart for details).   the patient's x-rays from earlier today show a nondisplaced fracture of the proximal phalanx. Patient was placed in a postoperative shoe, he was provided crutches, and his great toe was buddy taped. Discussed outpatient follow-up with sports medicine.  Final Clinical Impressions(s) / ED Diagnoses   Final diagnoses:  Closed displaced fracture of proximal phalanx of right great toe, initial encounter    New Prescriptions New Prescriptions   NAPROXEN (NAPROSYN) 500 MG TABLET    Take 1 tablet (500 mg total) by mouth 2 (two) times daily.   I personally performed the services described in this documentation, which was scribed in my presence.  The recorded information has been reviewed and is accurate.      Linwood Dibbles, MD 09/08/16 2042

## 2016-09-08 NOTE — ED Triage Notes (Addendum)
Pt reports kicking a door around 1300 today. Presents with R great toe pain extending into foot.

## 2017-01-15 ENCOUNTER — Emergency Department
Admission: EM | Admit: 2017-01-15 | Discharge: 2017-01-15 | Disposition: A | Discharge status: Home / Self Care | Payer: Self-pay | Attending: Emergency Medicine | Admitting: Emergency Medicine

## 2017-01-15 ENCOUNTER — Encounter: Payer: Self-pay | Admitting: Emergency Medicine

## 2017-01-15 DIAGNOSIS — Z202 Contact with and (suspected) exposure to infections with a predominantly sexual mode of transmission: Secondary | ICD-10-CM | POA: Insufficient documentation

## 2017-01-15 DIAGNOSIS — Z79899 Other long term (current) drug therapy: Secondary | ICD-10-CM | POA: Insufficient documentation

## 2017-01-15 DIAGNOSIS — J45909 Unspecified asthma, uncomplicated: Secondary | ICD-10-CM | POA: Insufficient documentation

## 2017-01-15 DIAGNOSIS — F17200 Nicotine dependence, unspecified, uncomplicated: Secondary | ICD-10-CM | POA: Insufficient documentation

## 2017-01-15 MED ORDER — PENICILLIN G BENZATHINE & PROC 1200000 UNIT/2ML IM SUSP
INTRAMUSCULAR | Status: AC
  Filled 2017-01-15: qty 2

## 2017-01-15 MED ORDER — PENICILLIN G BENZATHINE 1200000 UNIT/2ML IM SUSP
2.4000 10*6.[IU] | Freq: Once | INTRAMUSCULAR | Status: AC
  Administered 2017-01-15: 2.4 10*6.[IU] via INTRAMUSCULAR

## 2017-01-15 NOTE — ED Notes (Signed)
Pt left before receiving discharge instructions, refused to return to receive them. Pt ambulatory and in no observable distress at the time of exit.

## 2017-01-15 NOTE — ED Provider Notes (Signed)
[facility] Emergency Department Provider Note   ____________________________________________   First MD Initiated Contact with Patient 01/15/17 [phone]     (approximate)  I have reviewed the triage vital signs and the nursing notes.   HISTORY  Chief Complaint SEXUALLY TRANSMITTED DISEASE    HPI Frank Poole is a 26 y.o. male who comes into the hospital today stating that he had a call from someone who works for the state saying that he had been exposed to syphilis. The patient states that he received this call about 2-3 weeks ago. He is unsure exactly who this person's. The patient denies any penile lesions, discharge or lymph nodes in his groin. He denies any fevers. He reports he was told to come into the hospital to get treated. The patient is here today for treatment of this exposure. He has no other complaints at this time.   Past Medical History:  Diagnosis Date  . Asthma     There are no active problems to display for this patient.   History reviewed. No pertinent surgical history.  Prior to Admission medications   Medication Sig Start Date End Date Taking? Authorizing Provider  albuterol-ipratropium (COMBIVENT) 18-103 MCG/ACT inhaler Inhale 2 puffs into the lungs every 6 (six) hours as needed.    [provider]  cyclobenzaprine (FLEXERIL) 10 MG tablet Take 1 tablet (10 mg total) by mouth 2 (two) times daily as needed for muscle spasms. 11/25/15   Fayrene Helper, PA-C  naproxen (NAPROSYN) 500 MG tablet Take 1 tablet (500 mg total) by mouth 2 (two) times daily. 09/08/16   Linwood Dibbles, MD  predniSONE (DELTASONE) 20 MG tablet 3 tabs po day one, then 2 tabs daily x 4 days 11/25/15   Fayrene Helper, PA-C  promethazine (PHENERGAN) 25 MG tablet Take 1 tablet (25 mg total) by mouth every 6 (six) hours as needed for nausea. 10/05/11 10/12/11  Quita Skye, MD  traMADol (ULTRAM) 50 MG tablet Take 1 tablet (50 mg total) by mouth every 6 (six) hours as  needed for moderate pain. 11/25/15   Fayrene Helper, PA-C    Allergies Banana  No family history on file.  Social History Social History  Substance Use Topics  . Smoking status: Current Every Day Smoker    Types: Cigars  . Smokeless tobacco: Never Used  . Alcohol use No    Review of Systems  Constitutional: No fever/chills Eyes: No visual changes. ENT: No sore throat. Cardiovascular: Denies chest pain. Respiratory: Denies shortness of breath. Gastrointestinal: No abdominal pain.  No nausea, no vomiting.  No diarrhea.  No constipation. Genitourinary: Negative for dysuria. Musculoskeletal: Negative for back pain. Skin: Negative for rash. Neurological: Negative for headaches, focal weakness or numbness.   ____________________________________________   PHYSICAL EXAM:  VITAL SIGNS: ED Triage Vitals  Enc Vitals Group     BP 01/15/17 0122 (!) 149/90     Pulse Rate 01/15/17 0122 100     Resp 01/15/17 0122 20     Temp 01/15/17 0122 98 F (36.7 C)     Temp Source 01/15/17 0122 Oral     SpO2 01/15/17 0122 97 %     Weight 01/15/17 0121 220 lb (99.8 kg)     Height 01/15/17 0121 6' (1.829 m)     Head Circumference --      Peak Flow --      Pain Score --      Pain Loc --      Pain Edu? --  Excl. in GC? --     Constitutional: Alert and oriented. Well appearing and in no acute distress. Eyes: Conjunctivae are normal. PERRL. EOMI. Head: Atraumatic. Nose: No congestion/rhinnorhea. Mouth/Throat: Mucous membranes are moist.  Oropharynx non-erythematous. Cardiovascular: Normal rate, regular rhythm. Grossly normal heart sounds.  Good peripheral circulation. Respiratory: Normal respiratory effort.  No retractions. Lungs CTAB. Gastrointestinal: Soft and nontender. No distention.  Musculoskeletal: No lower extremity tenderness nor edema.   Neurologic:  Normal speech and language.  Skin:  Skin is warm, dry and intact.  Psychiatric: Mood and affect are normal.    ____________________________________________   LABS (all labs ordered are listed, but only abnormal results are displayed)  Labs Reviewed  RPR   ____________________________________________  EKG  none ____________________________________________  RADIOLOGY  No results found.  ____________________________________________   PROCEDURES  Procedure(s) performed: None  Procedures  Critical Care performed: No  ____________________________________________   INITIAL IMPRESSION / ASSESSMENT AND PLAN / ED COURSE  Pertinent labs & imaging results that were available during my care of the patient were reviewed by me and considered in my medical decision making (see chart for details).  This is a 26 year old male who comes into the hospital today after being told he was exposed to someone who had syphilis. The patient is here for treatment. I will send an RPR on the patient and give him a shot of penicillin G 2.4 million units. As the patient has no other complaints he will be discharged.      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    Rebecka Apley, MD 01/15/17 [phone]

## 2017-01-15 NOTE — ED Triage Notes (Signed)
Patient ambulatory to triage with steady gait, without difficulty or distress noted; pt reports he was called and notified that he was exposed to syphllis; denies any c/o

## 2017-01-16 LAB — RPR: RPR: NONREACTIVE

## 2018-02-03 IMAGING — CR DG HIP (WITH OR WITHOUT PELVIS) 2-3V*L*
3 series · 3 of 3 positions shown · non-contrast
Comparison: None.

CLINICAL DATA: Left leg pain for 6 months.  No reported injury.

EXAM:
DG HIP (WITH OR WITHOUT PELVIS) 2-3V LEFT

[t pelvis a.p.]
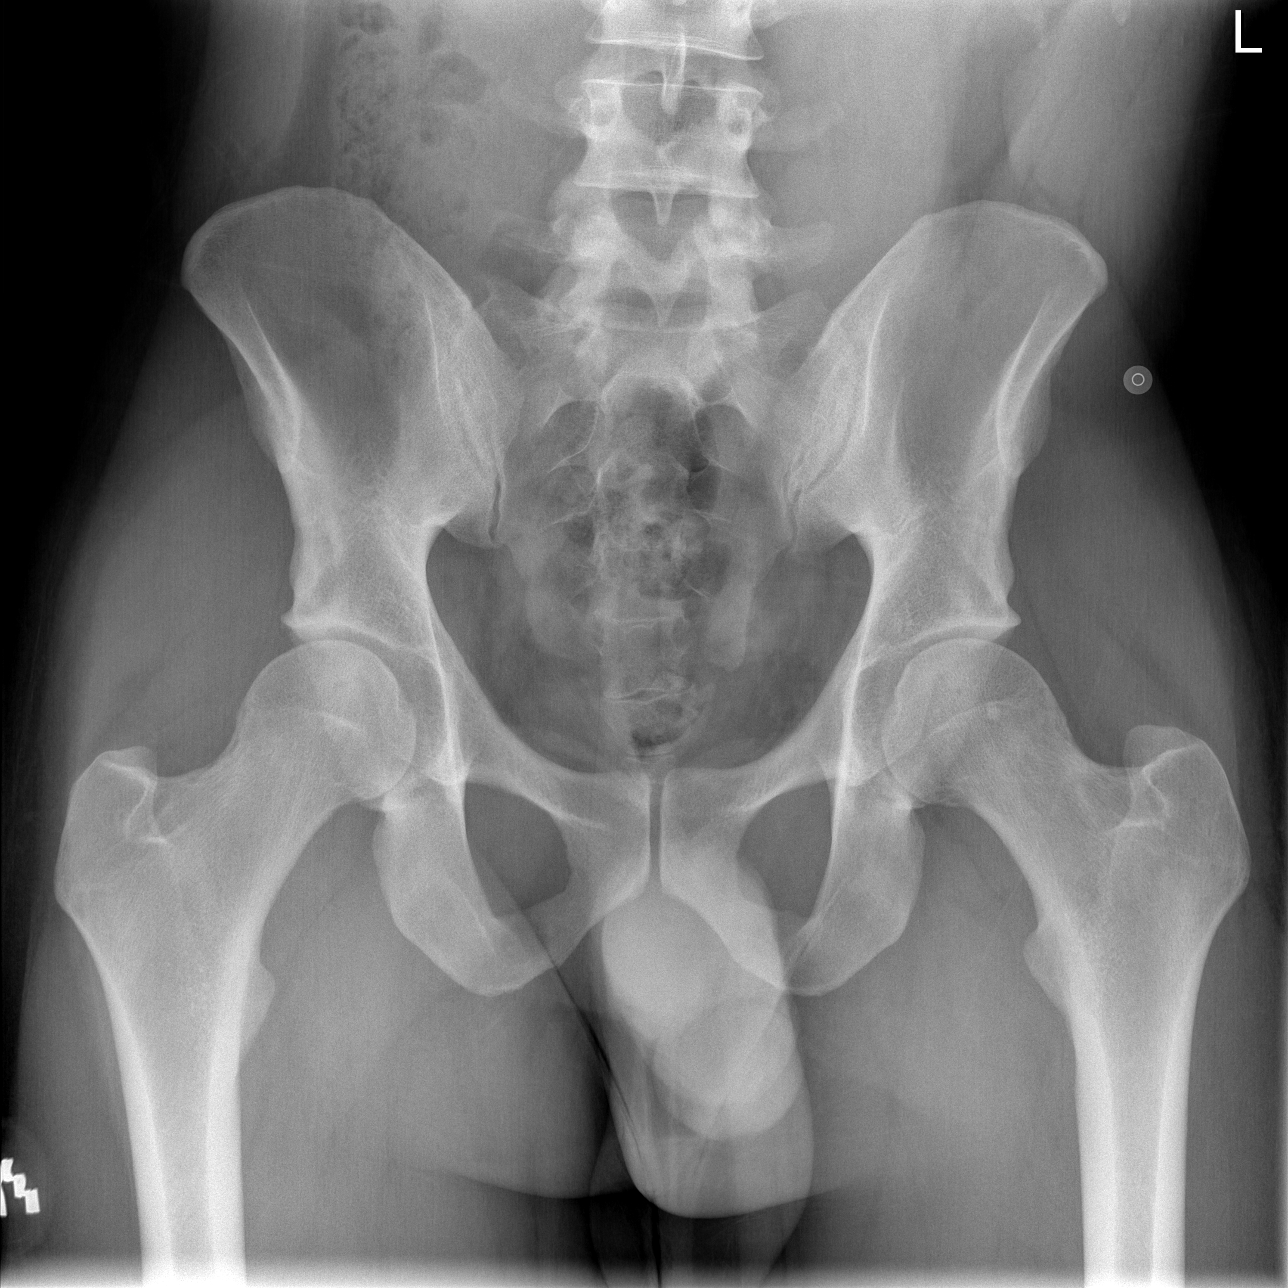

[t hip ap left]
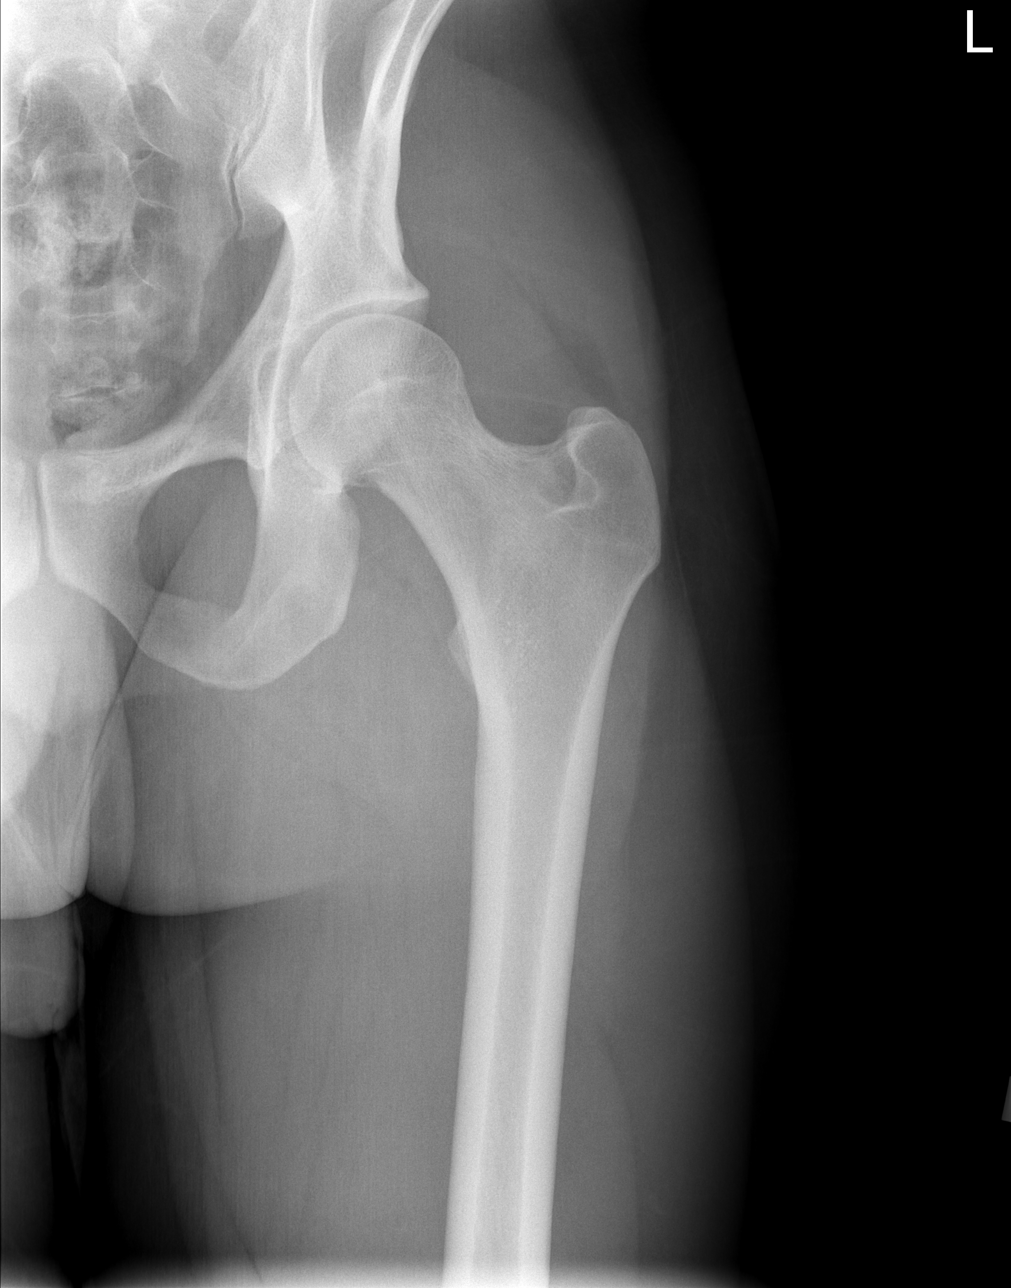

[t hip frog leg left]
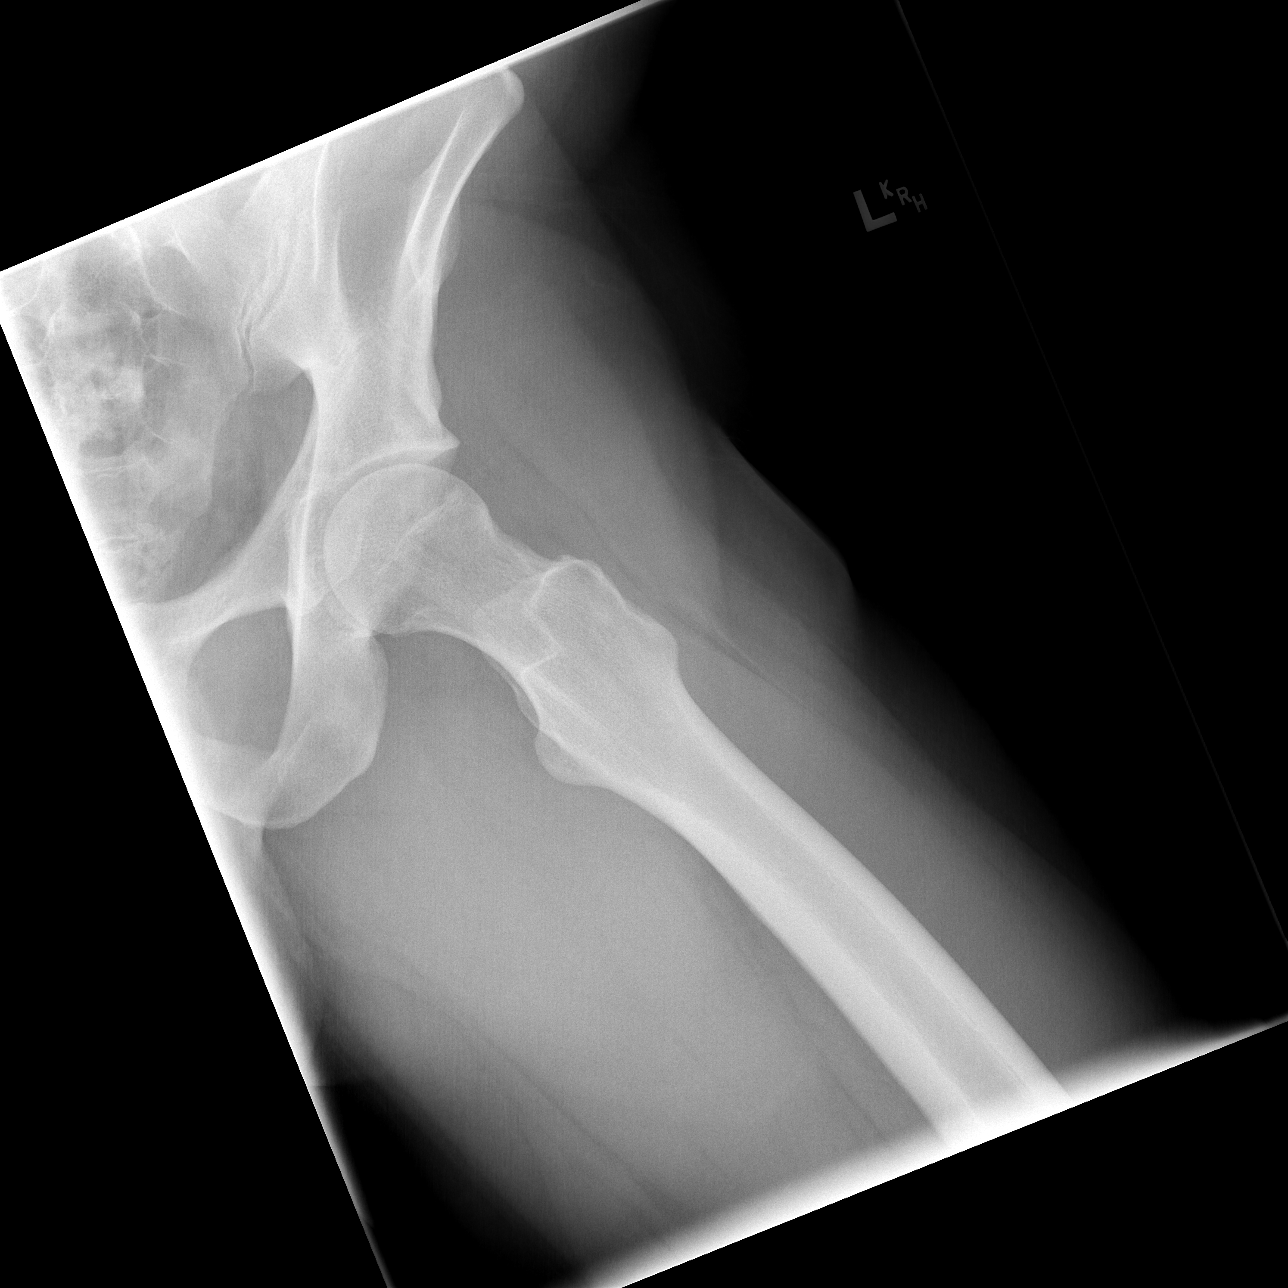

[3 of 3 positions shown; findings below may reference images not displayed]

FINDINGS: There is no evidence of hip fracture or dislocation. There is no
evidence of arthropathy or other focal bone abnormality.
IMPRESSION: Normal left hip.

## 2018-11-18 IMAGING — DX DG TOE GREAT 2+V*R*
3 series · 3 of 3 positions shown · non-contrast
Comparison: None.

CLINICAL DATA: Acute right great toe pain following injury today.
Initial encounter.

EXAM:
RIGHT GREAT TOE

[toe ap]
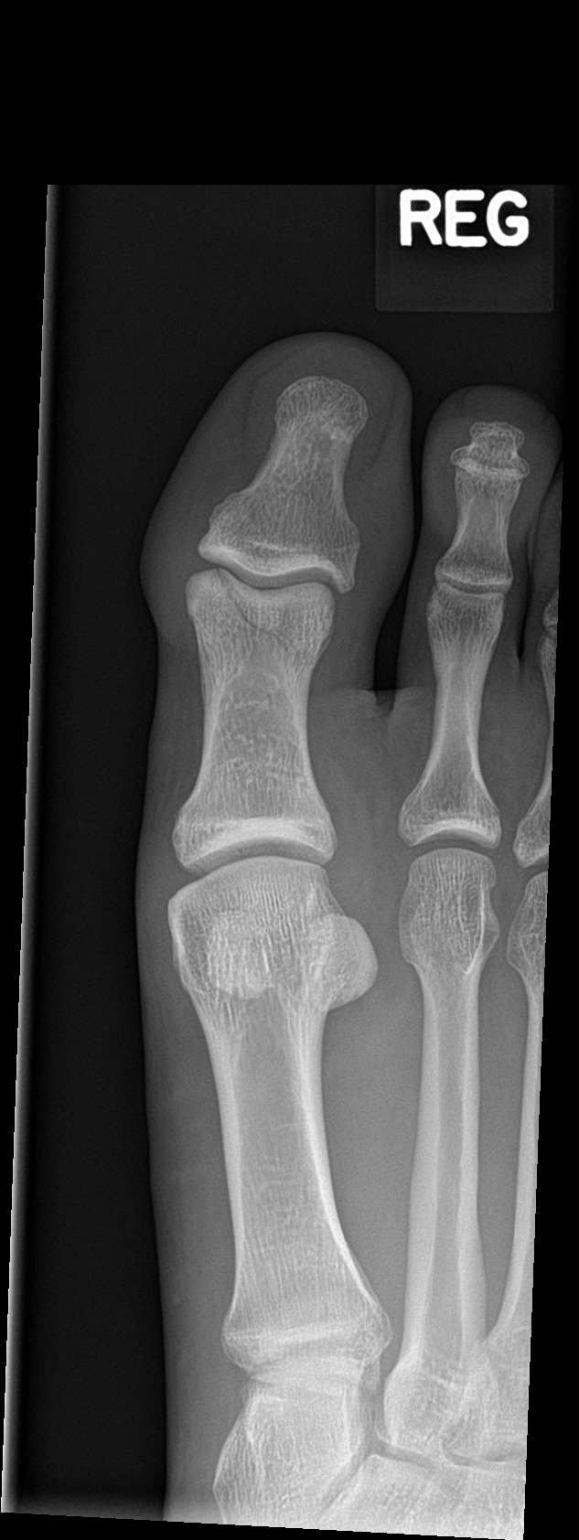

[toe obl]
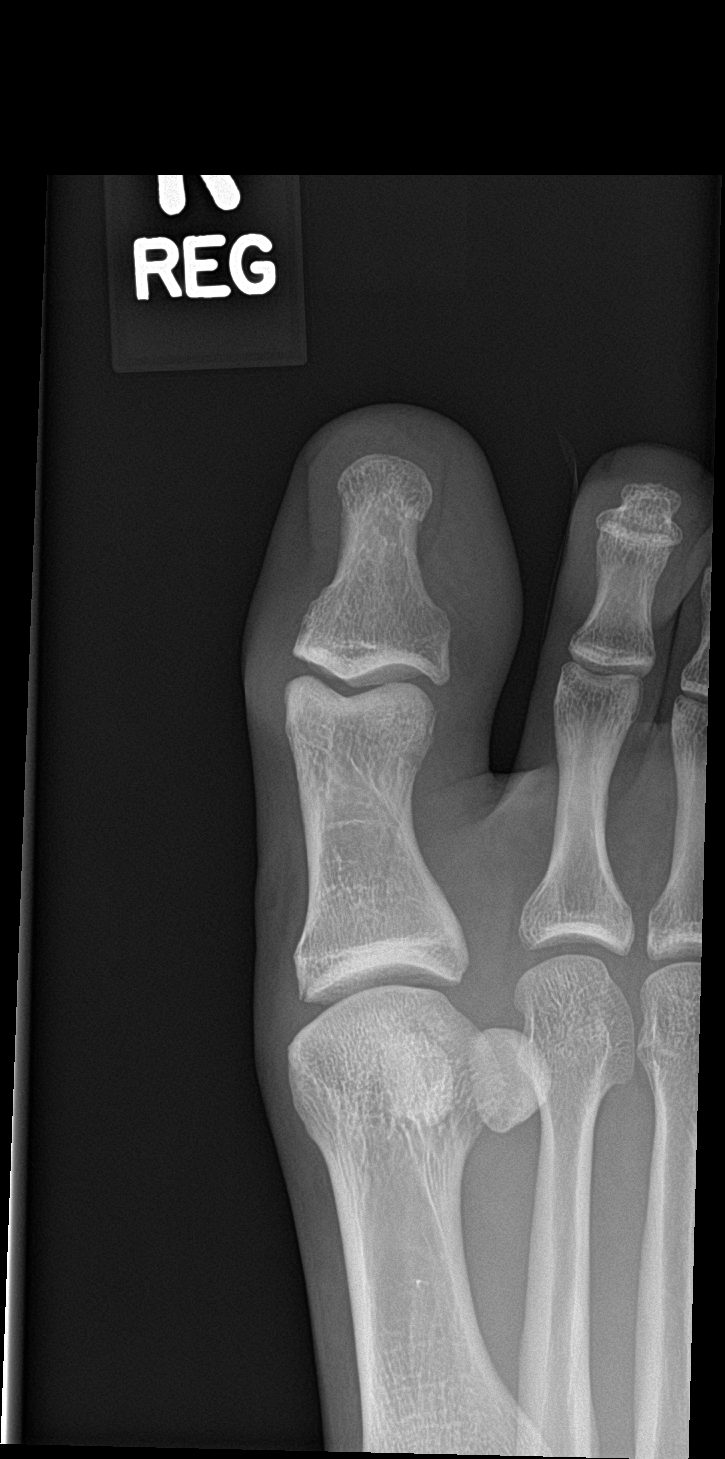

[toe lat]
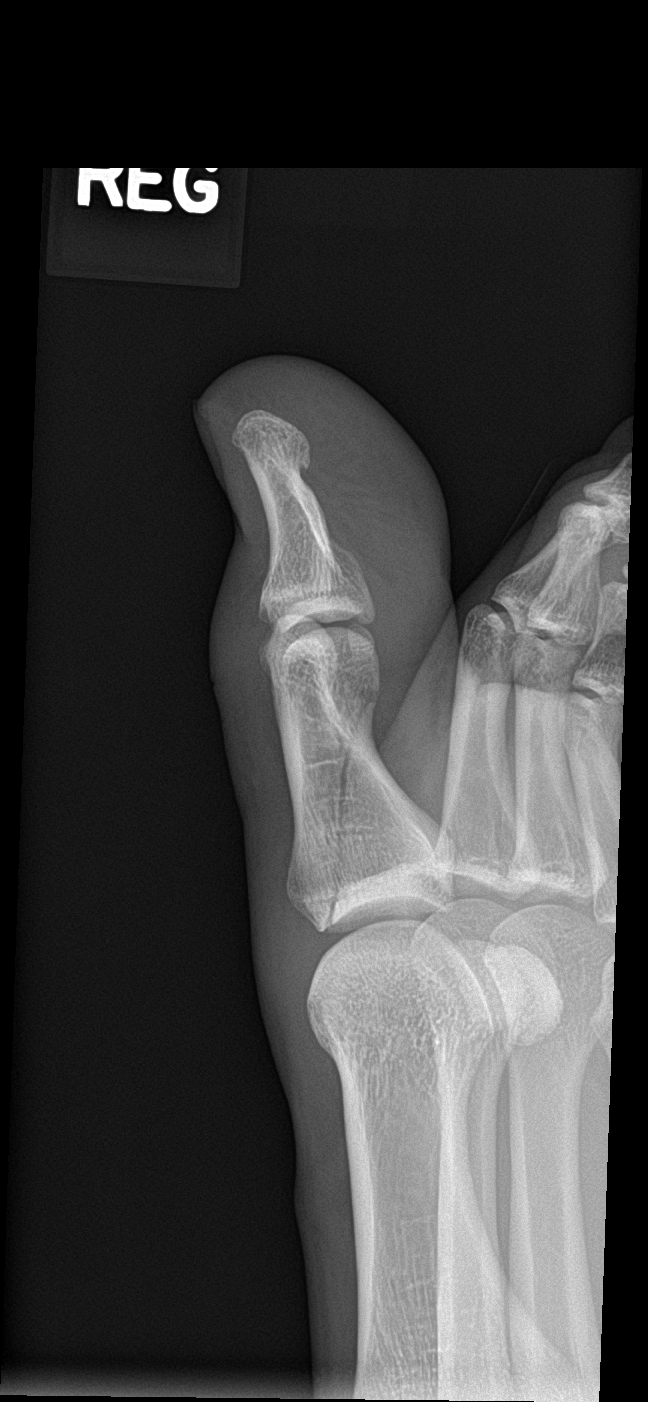

[3 of 3 positions shown; findings below may reference images not displayed]

FINDINGS: A nondisplaced fracture of the proximal phalanx is noted extending
from the MTP joint to the interphalangeal joint.

No subluxation or dislocation noted.
IMPRESSION: Nondisplaced fracture of the proximal phalanx as described.

## 2020-06-03 ENCOUNTER — Other Ambulatory Visit: Payer: Self-pay

## 2020-06-03 ENCOUNTER — Emergency Department (HOSPITAL_BASED_OUTPATIENT_CLINIC_OR_DEPARTMENT_OTHER): Payer: Self-pay

## 2020-06-03 ENCOUNTER — Emergency Department (HOSPITAL_BASED_OUTPATIENT_CLINIC_OR_DEPARTMENT_OTHER)
Admission: EM | Admit: 2020-06-03 | Discharge: 2020-06-03 | Disposition: A | Discharge status: Home / Self Care | Payer: Self-pay | Attending: Emergency Medicine | Admitting: Emergency Medicine

## 2020-06-03 ENCOUNTER — Encounter (HOSPITAL_BASED_OUTPATIENT_CLINIC_OR_DEPARTMENT_OTHER): Payer: Self-pay

## 2020-06-03 DIAGNOSIS — S79912A Unspecified injury of left hip, initial encounter: Secondary | ICD-10-CM | POA: Insufficient documentation

## 2020-06-03 DIAGNOSIS — F17290 Nicotine dependence, other tobacco product, uncomplicated: Secondary | ICD-10-CM | POA: Insufficient documentation

## 2020-06-03 DIAGNOSIS — M25552 Pain in left hip: Secondary | ICD-10-CM

## 2020-06-03 DIAGNOSIS — W19XXXA Unspecified fall, initial encounter: Secondary | ICD-10-CM | POA: Insufficient documentation

## 2020-06-03 DIAGNOSIS — J45909 Unspecified asthma, uncomplicated: Secondary | ICD-10-CM | POA: Insufficient documentation

## 2020-06-03 MED ORDER — KETOROLAC TROMETHAMINE 60 MG/2ML IM SOLN
60.0000 mg | Freq: Once | INTRAMUSCULAR | Status: AC
  Administered 2020-06-03: 60 mg via INTRAMUSCULAR
  Filled 2020-06-03: qty 2

## 2020-06-03 MED ORDER — IBUPROFEN 800 MG PO TABS: 800.0000 mg | ORAL_TABLET | Freq: Four times a day (QID) | ORAL | 0 refills | Status: DC | PRN

## 2020-06-03 MED ORDER — METHOCARBAMOL 500 MG PO TABS: 500.0000 mg | ORAL_TABLET | Freq: Three times a day (TID) | ORAL | 0 refills | Status: AC | PRN

## 2020-06-03 NOTE — ED Provider Notes (Signed)
[facility] EMERGENCY DEPARTMENT Provider Note   CSN: [id number] Arrival date & time: 06/03/20  0531     History Chief Complaint  Patient presents with  . Hip Pain    Frank Poole is a 30 y.o. male.  Patient presents with persistent left hip pain after injury.  Patient reports that yesterday he was sitting, tried to stand and felt a pop in his left hip followed by onset of sharp pain.  Pain is primarily in the anterior hip/groin area.  Pain worsens when he tries to walk.        Past Medical History:  Diagnosis Date  . Asthma     There are no problems to display for this patient.   History reviewed. No pertinent surgical history.     History reviewed. No pertinent family history.  Social History   Tobacco Use  . Smoking status: Current Every Day Smoker    Types: Cigars  . Smokeless tobacco: Never Used  Substance Use Topics  . Alcohol use: No  . Drug use: Yes    Types: Marijuana    Comment: daily    Home Medications Prior to Admission medications   Medication Sig Start Date End Date Taking? Authorizing Provider  albuterol-ipratropium (COMBIVENT) 18-103 MCG/ACT inhaler Inhale 2 puffs into the lungs every 6 (six) hours as needed.  06/03/20  [provider]  promethazine (PHENERGAN) 25 MG tablet Take 1 tablet (25 mg total) by mouth every 6 (six) hours as needed for nausea. 10/05/11 06/03/20  Quita Skye, MD    Allergies    Banana  Review of Systems   Review of Systems  Musculoskeletal: Positive for arthralgias.  All other systems reviewed and are negative.   Physical Exam Updated Vital Signs BP (!) 146/96   Pulse 71   Temp 98 F (36.7 C)   Resp 17   Ht 6' (1.829 m)   Wt 99.8 kg   SpO2 99%   BMI 29.84 kg/m   Physical Exam Vitals and nursing note reviewed.  Constitutional:      Appearance: Normal appearance.  HENT:     Head: Atraumatic.  Musculoskeletal:     Left hip: Tenderness present. No deformity or crepitus.  Decreased range of motion.     Comments: Pain with lateral rotation of left hip as well as flexion  Skin:    General: Skin is warm and dry.     Findings: No rash.  Neurological:     General: No focal deficit present.     Mental Status: He is alert and oriented to person, place, and time.     Cranial Nerves: No cranial nerve deficit.     Sensory: No sensory deficit.     Motor: No weakness.  Psychiatric:        Mood and Affect: Mood normal.     ED Results / Procedures / Treatments   Labs (all labs ordered are listed, but only abnormal results are displayed) Labs Reviewed - No data to display  EKG None  Radiology DG Hip Unilat W or Wo Pelvis 2-3 Views Left  Result Date: 06/03/2020 CLINICAL DATA:  Hip pain EXAM: DG HIP (WITH OR WITHOUT PELVIS) 2-3V LEFT COMPARISON:  None. FINDINGS: There is no evidence of hip fracture or dislocation. There is no evidence of arthropathy or other focal bone abnormality. IMPRESSION: Negative. Electronically Signed   By: Helyn Numbers MD   On: 06/03/2020 06:11    Procedures Procedures (including critical care time)  Medications  Ordered in ED Medications - No data to display  ED Course  I have reviewed the triage vital signs and the nursing notes.  Pertinent labs & imaging results that were available during my care of the patient were reviewed by me and considered in my medical decision making (see chart for details).    MDM Rules/Calculators/A&P                          Patient felt a pop and sudden onset of pain in the left hip when trying to stand from a seated position yesterday.  He has had continued pain since.  Patient does have increased pain with trying to externally rotate and flex the hip.  X-ray does not show any acute pathology.  Symptoms consistent with soft tissue injury, treat with rest and analgesia.  Final Clinical Impression(s) / ED Diagnoses Final diagnoses:  Acute pain of left hip    Rx / DC Orders ED Discharge Orders     None       Gilda Crease, MD 06/03/20 [phone]

## 2020-06-03 NOTE — ED Triage Notes (Signed)
Endorsing left hip pain that started yesterday afternoon after standing

## 2020-12-11 ENCOUNTER — Emergency Department (HOSPITAL_BASED_OUTPATIENT_CLINIC_OR_DEPARTMENT_OTHER)
Admission: EM | Admit: 2020-12-11 | Discharge: 2020-12-11 | Disposition: A | Discharge status: Home / Self Care | Payer: Self-pay | Attending: Emergency Medicine | Admitting: Emergency Medicine

## 2020-12-11 ENCOUNTER — Encounter (HOSPITAL_BASED_OUTPATIENT_CLINIC_OR_DEPARTMENT_OTHER): Payer: Self-pay

## 2020-12-11 ENCOUNTER — Other Ambulatory Visit: Payer: Self-pay

## 2020-12-11 DIAGNOSIS — R5383 Other fatigue: Secondary | ICD-10-CM | POA: Insufficient documentation

## 2020-12-11 DIAGNOSIS — J45909 Unspecified asthma, uncomplicated: Secondary | ICD-10-CM | POA: Insufficient documentation

## 2020-12-11 DIAGNOSIS — F17210 Nicotine dependence, cigarettes, uncomplicated: Secondary | ICD-10-CM | POA: Insufficient documentation

## 2020-12-11 DIAGNOSIS — J020 Streptococcal pharyngitis: Secondary | ICD-10-CM | POA: Insufficient documentation

## 2020-12-11 LAB — GROUP A STREP BY PCR: Group A Strep by PCR: DETECTED — AB

## 2020-12-11 MED ORDER — IBUPROFEN 600 MG PO TABS: 600.0000 mg | ORAL_TABLET | Freq: Four times a day (QID) | ORAL | 0 refills | Status: AC | PRN

## 2020-12-11 MED ORDER — PENICILLIN V POTASSIUM 500 MG PO TABS: 500.0000 mg | ORAL_TABLET | Freq: Four times a day (QID) | ORAL | 0 refills | Status: AC

## 2020-12-11 MED ORDER — IBUPROFEN 400 MG PO TABS
600.0000 mg | ORAL_TABLET | Freq: Once | ORAL | Status: AC
  Administered 2020-12-11: 600 mg via ORAL
  Filled 2020-12-11: qty 1

## 2020-12-11 NOTE — ED Triage Notes (Signed)
Pt presents with 2 day history of bodyaches, fatigued, sore throat, and subjective fevers.

## 2020-12-11 NOTE — ED Provider Notes (Signed)
[facility] EMERGENCY DEPARTMENT Provider Note   CSN: [id number] Arrival date & time: 12/11/20  1759     History Chief Complaint  Patient presents with   Generalized Body Aches    Frank Poole is a 30 y.o. male.  HPI Patient is a 32-year-old male presented today with a sore throat for 2 days.  He also complains of some body aches fatigue and subjective fevers.  No nausea vomiting or diarrhea.  No chest pain or shortness of breath.  No coughing.  No congestion.  No other associate symptoms.  Aggravating mitigate factors.  Denies any sick contact.  States that he took a home COVID test which was negative.    Past Medical History:  Diagnosis Date   Asthma     There are no problems to display for this patient.   History reviewed. No pertinent surgical history.     No family history on file.  Social History   Tobacco Use   Smoking status: Every Day    Types: Cigars, Cigarettes   Smokeless tobacco: Never  Vaping Use   Vaping Use: Never used  Substance Use Topics   Alcohol use: Yes   Drug use: Yes    Types: Marijuana    Comment: daily    Home Medications Prior to Admission medications   Medication Sig Start Date End Date Taking? Authorizing Provider  ibuprofen (ADVIL) 600 MG tablet Take 1 tablet (600 mg total) by mouth every 6 (six) hours as needed. 12/11/20  Yes Jackline Castilla, Stevphen Meuse S, PA  penicillin v potassium (VEETID) 500 MG tablet Take 1 tablet (500 mg total) by mouth 4 (four) times daily for 10 days. 12/11/20 12/21/20 Yes Deuce Paternoster S, PA  methocarbamol (ROBAXIN) 500 MG tablet Take 1 tablet (500 mg total) by mouth every 8 (eight) hours as needed for muscle spasms. 06/03/20   Gilda Crease, MD  albuterol-ipratropium (COMBIVENT) 18-103 MCG/ACT inhaler Inhale 2 puffs into the lungs every 6 (six) hours as needed.  06/03/20  [provider]  promethazine (PHENERGAN) 25 MG tablet Take 1 tablet (25 mg total) by mouth every 6 (six) hours as  needed for nausea. 10/05/11 06/03/20  Quita Skye, MD    Allergies    Banana  Review of Systems   Review of Systems  Constitutional:  Positive for fatigue and fever. Negative for chills.  HENT:  Negative for congestion.   Eyes:  Negative for pain.  Respiratory:  Negative for cough and shortness of breath.   Cardiovascular:  Negative for chest pain and leg swelling.  Gastrointestinal:  Negative for abdominal pain, nausea and vomiting.  Genitourinary:  Negative for dysuria.  Musculoskeletal:  Positive for myalgias. Negative for neck pain.  Skin:  Negative for rash.  Neurological:  Negative for dizziness and headaches.  Psychiatric/Behavioral:  The patient is not nervous/anxious.    Physical Exam Updated Vital Signs BP (!) 136/93   Pulse 68   Temp 98.4 F (36.9 C) (Oral)   Resp 20   Ht 6' (1.829 m)   Wt 99.8 kg   SpO2 98%   BMI 29.84 kg/m   Physical Exam Vitals and nursing note reviewed.  Constitutional:      General: He is not in acute distress. HENT:     Head: Normocephalic and atraumatic.     Nose: Nose normal.     Mouth/Throat:     Mouth: Mucous membranes are moist.     Comments: Posterior pharynx erythema, some tonsillar hypertrophy no exudates  Eyes:     General: No scleral icterus. Cardiovascular:     Rate and Rhythm: Normal rate and regular rhythm.     Pulses: Normal pulses.     Heart sounds: Normal heart sounds.  Pulmonary:     Effort: Pulmonary effort is normal. No respiratory distress.     Breath sounds: No wheezing.  Abdominal:     Palpations: Abdomen is soft.     Tenderness: There is no abdominal tenderness.  Musculoskeletal:     Cervical back: Normal range of motion.     Right lower leg: No edema.     Left lower leg: No edema.  Lymphadenopathy:     Cervical: Cervical adenopathy present.  Skin:    General: Skin is warm and dry.     Capillary Refill: Capillary refill takes less than 2 seconds.  Neurological:     Mental Status: He is alert.  Mental status is at baseline.  Psychiatric:        Mood and Affect: Mood normal.        Behavior: Behavior normal.    ED Results / Procedures / Treatments   Labs (all labs ordered are listed, but only abnormal results are displayed) Labs Reviewed  GROUP A STREP BY PCR - Abnormal; Notable for the following components:      Result Value   Group A Strep by PCR DETECTED (*)    All other components within normal limits    EKG None  Radiology No results found.  Procedures Procedures   Medications Ordered in ED Medications  ibuprofen (ADVIL) tablet 600 mg (600 mg Oral Given 12/11/20 1915)    ED Course  I have reviewed the triage vital signs and the nursing notes.  Pertinent labs & imaging results that were available during my care of the patient were reviewed by me and considered in my medical decision making (see chart for details).    MDM Rules/Calculators/A&P                          Patient diagnosed here with strep throat.  Diagnosed with antibiotics.  Given 1 dose ibuprofen prior to discharge.  Offered COVID/influenza testing which he declined.  No evidence of RTA/PTA.  Return precautions given.  Will follow up with PCP   Final Clinical Impression(s) / ED Diagnoses Final diagnoses:  Strep throat    Rx / DC Orders ED Discharge Orders          Ordered    penicillin v potassium (VEETID) 500 MG tablet  4 times daily        12/11/20 1908    ibuprofen (ADVIL) 600 MG tablet  Every 6 hours PRN        12/11/20 1908             Gailen Shelter, [place] 12/11/20 1917    Melene Plan, DO 12/11/20 2236

## 2020-12-11 NOTE — Discharge Instructions (Addendum)
You were diagnosed with strep throat today.   This is a contagious disease. Please do not share eating utensils or glasses.  Or cups.  Please use Tylenol or ibuprofen for pain.  You may use 600 mg ibuprofen every 6 hours or 1000 mg of Tylenol every 6 hours.  You may choose to alternate between the 2.  This would be most effective.  Not to exceed 4 g of Tylenol within 24 hours.  Not to exceed 3200 mg ibuprofen 24 hours.

## 2022-06-06 ENCOUNTER — Encounter (HOSPITAL_COMMUNITY): Payer: Self-pay

## 2022-06-06 ENCOUNTER — Emergency Department (HOSPITAL_COMMUNITY)
Admission: EM | Admit: 2022-06-06 | Discharge: 2022-06-07 | Discharge status: Against Medical Advice | Payer: Self-pay | Attending: Emergency Medicine | Admitting: Emergency Medicine

## 2022-06-06 ENCOUNTER — Other Ambulatory Visit: Payer: Self-pay

## 2022-06-06 DIAGNOSIS — Z1152 Encounter for screening for COVID-19: Secondary | ICD-10-CM | POA: Insufficient documentation

## 2022-06-06 DIAGNOSIS — Z5321 Procedure and treatment not carried out due to patient leaving prior to being seen by health care provider: Secondary | ICD-10-CM | POA: Insufficient documentation

## 2022-06-06 DIAGNOSIS — M791 Myalgia, unspecified site: Secondary | ICD-10-CM | POA: Insufficient documentation

## 2022-06-06 DIAGNOSIS — R059 Cough, unspecified: Secondary | ICD-10-CM | POA: Insufficient documentation

## 2022-06-06 DIAGNOSIS — J029 Acute pharyngitis, unspecified: Secondary | ICD-10-CM | POA: Insufficient documentation

## 2022-06-06 LAB — RESP PANEL BY RT-PCR (RSV, FLU A&B, COVID)  RVPGX2
Influenza A by PCR: NEGATIVE
Influenza B by PCR: NEGATIVE
Resp Syncytial Virus by PCR: NEGATIVE
SARS Coronavirus 2 by RT PCR: NEGATIVE

## 2022-06-06 NOTE — ED Triage Notes (Signed)
Pt reports having cough, sore throat, and generalized body aches x 3 days.

## 2022-06-07 ENCOUNTER — Emergency Department (HOSPITAL_BASED_OUTPATIENT_CLINIC_OR_DEPARTMENT_OTHER)
Admission: EM | Admit: 2022-06-07 | Discharge: 2022-06-07 | Disposition: A | Discharge status: Home / Self Care | Payer: Self-pay | Attending: Emergency Medicine | Admitting: Emergency Medicine

## 2022-06-07 ENCOUNTER — Encounter (HOSPITAL_BASED_OUTPATIENT_CLINIC_OR_DEPARTMENT_OTHER): Payer: Self-pay | Admitting: Urology

## 2022-06-07 DIAGNOSIS — J020 Streptococcal pharyngitis: Secondary | ICD-10-CM | POA: Insufficient documentation

## 2022-06-07 LAB — GROUP A STREP BY PCR: Group A Strep by PCR: DETECTED — AB

## 2022-06-07 MED ORDER — PENICILLIN G BENZATHINE 1200000 UNIT/2ML IM SUSY
1.2000 10*6.[IU] | PREFILLED_SYRINGE | Freq: Once | INTRAMUSCULAR | Status: AC
  Administered 2022-06-07: 1.2 10*6.[IU] via INTRAMUSCULAR
  Filled 2022-06-07: qty 2

## 2022-06-07 NOTE — ED Provider Notes (Signed)
[facility] EMERGENCY DEPARTMENT Provider Note   CSN: [id number] Arrival date & time: 06/07/22  0050     History  Chief Complaint  Patient presents with   flu like symptoms    Frank Poole is a 32 y.o. male.  32 yo M with a chief complaint of a sore throat.  Going on for a couple days.  Actually got much better after he started drinking lots of fluids.  He feels gets worse at night.  He has not had much cough or congestion.  Had a little bit of myalgias that went away after about 12 hours.  His girlfriend is sick with a similar illness.        Home Medications Prior to Admission medications   Medication Sig Start Date End Date Taking? Authorizing Provider  ibuprofen (ADVIL) 600 MG tablet Take 1 tablet (600 mg total) by mouth every 6 (six) hours as needed. 12/11/20   Tedd Sias, PA  methocarbamol (ROBAXIN) 500 MG tablet Take 1 tablet (500 mg total) by mouth every 8 (eight) hours as needed for muscle spasms. 06/03/20   Orpah Greek, MD  albuterol-ipratropium (COMBIVENT) 18-103 MCG/ACT inhaler Inhale 2 puffs into the lungs every 6 (six) hours as needed.  06/03/20  [provider]  promethazine (PHENERGAN) 25 MG tablet Take 1 tablet (25 mg total) by mouth every 6 (six) hours as needed for nausea. 10/05/11 06/03/20  Kingsley Spittle, MD      Allergies    Banana    Review of Systems   Review of Systems  Physical Exam Updated Vital Signs BP (!) 158/92 (BP Location: Left Arm)   Pulse 75   Temp 98.5 F (36.9 C)   Resp 20   Ht 6' (1.829 m)   Wt 102.1 kg   SpO2 98%   BMI 30.52 kg/m  Physical Exam Vitals and nursing note reviewed.  Constitutional:      Appearance: He is well-developed.  HENT:     Head: Normocephalic and atraumatic.     Mouth/Throat:     Comments: Faint amount of exudate at the left tonsillar pillar. Eyes:     Pupils: Pupils are equal, round, and reactive to light.  Neck:     Vascular: No JVD.  Cardiovascular:     Rate  and Rhythm: Normal rate and regular rhythm.     Heart sounds: No murmur heard.    No friction rub. No gallop.  Pulmonary:     Effort: No respiratory distress.     Breath sounds: No wheezing.  Abdominal:     General: There is no distension.     Tenderness: There is no abdominal tenderness. There is no guarding or rebound.  Musculoskeletal:        General: Normal range of motion.     Cervical back: Normal range of motion and neck supple.  Skin:    Coloration: Skin is not pale.     Findings: No rash.  Neurological:     Mental Status: He is alert and oriented to person, place, and time.  Psychiatric:        Behavior: Behavior normal.     ED Results / Procedures / Treatments   Labs (all labs ordered are listed, but only abnormal results are displayed) Labs Reviewed - No data to display  EKG None  Radiology No results found.  Procedures Procedures    Medications Ordered in ED Medications  penicillin g benzathine (BICILLIN LA) 1200000 UNIT/2ML injection 1.2 Million  Units (has no administration in time range)    ED Course/ Medical Decision Making/ A&P                           Medical Decision Making Risk Prescription drug management.   32 yo M with a chief complaint of a sore throat.  This has been going on for couple days.  Actually got a little bit better.  He went with his girlfriend today to the [facility] emergency department to be evaluated due to the long wait he left and they came here to be evaluated.  He had a strep test there that was positive.  Will treat with an IM injection.  PCP follow-up.  1:47 AM:  I have discussed the diagnosis/risks/treatment options with the patient.  Evaluation and diagnostic testing in the emergency department does not suggest an emergent condition requiring admission or immediate intervention beyond what has been performed at this time.  They will follow up with PCP. We also discussed returning to the ED immediately if new or  worsening sx occur. We discussed the sx which are most concerning (e.g., sudden worsening pain, fever, inability to tolerate by mouth) that necessitate immediate return. Medications administered to the patient during their visit and any new prescriptions provided to the patient are listed below.  Medications given during this visit Medications  penicillin g benzathine (BICILLIN LA) 1200000 UNIT/2ML injection 1.2 Million Units (has no administration in time range)     The patient appears reasonably screen and/or stabilized for discharge and I doubt any other medical condition or other [facility] requiring further screening, evaluation, or treatment in the ED at this time prior to discharge.          Final Clinical Impression(s) / ED Diagnoses Final diagnoses:  Strep pharyngitis    Rx / DC Orders ED Discharge Orders     None         Deno Etienne, DO 06/07/22 0147

## 2022-06-07 NOTE — ED Notes (Signed)
[facility] called. Pt is there.  

## 2022-06-07 NOTE — ED Triage Notes (Signed)
Pt states cough, chills, sore throat, body aches since Sunday  Exposure to flu Positive strep, negative covid/flu at [place] earlier today

## 2022-06-07 NOTE — Discharge Instructions (Signed)
Take tylenol 2 pills 4 times a day and motrin 4 pills 3 times a day.  Drink plenty of fluids.  Return for worsening shortness of breath, headache, confusion. Follow up with your family doctor.   

## 2022-10-15 ENCOUNTER — Emergency Department (HOSPITAL_BASED_OUTPATIENT_CLINIC_OR_DEPARTMENT_OTHER)
Admission: EM | Admit: 2022-10-15 | Discharge: 2022-10-15 | Disposition: A | Discharge status: Home / Self Care | Payer: No Typology Code available for payment source | Attending: Emergency Medicine | Admitting: Emergency Medicine

## 2022-10-15 ENCOUNTER — Other Ambulatory Visit: Payer: Self-pay

## 2022-10-15 ENCOUNTER — Emergency Department (HOSPITAL_BASED_OUTPATIENT_CLINIC_OR_DEPARTMENT_OTHER): Payer: No Typology Code available for payment source

## 2022-10-15 ENCOUNTER — Encounter (HOSPITAL_BASED_OUTPATIENT_CLINIC_OR_DEPARTMENT_OTHER): Payer: Self-pay | Admitting: Emergency Medicine

## 2022-10-15 DIAGNOSIS — S80211A Abrasion, right knee, initial encounter: Secondary | ICD-10-CM | POA: Insufficient documentation

## 2022-10-15 DIAGNOSIS — Y92410 Unspecified street and highway as the place of occurrence of the external cause: Secondary | ICD-10-CM | POA: Diagnosis not present

## 2022-10-15 DIAGNOSIS — Z23 Encounter for immunization: Secondary | ICD-10-CM | POA: Insufficient documentation

## 2022-10-15 DIAGNOSIS — S8991XA Unspecified injury of right lower leg, initial encounter: Secondary | ICD-10-CM | POA: Diagnosis present

## 2022-10-15 DIAGNOSIS — T148XXA Other injury of unspecified body region, initial encounter: Secondary | ICD-10-CM

## 2022-10-15 LAB — CBC WITH DIFFERENTIAL/PLATELET
Abs Immature Granulocytes: 0.02 10*3/uL (ref 0.00–0.07)
Basophils Absolute: 0.1 10*3/uL (ref 0.0–0.1)
Basophils Relative: 1 %
Eosinophils Absolute: 0.3 10*3/uL (ref 0.0–0.5)
Eosinophils Relative: 3 %
HCT: 40.2 % (ref 39.0–52.0)
Hemoglobin: 13.3 g/dL (ref 13.0–17.0)
Immature Granulocytes: 0 %
Lymphocytes Relative: 24 %
Lymphs Abs: 2.4 10*3/uL (ref 0.7–4.0)
MCH: 29 pg (ref 26.0–34.0)
MCHC: 33.1 g/dL (ref 30.0–36.0)
MCV: 87.8 fL (ref 80.0–100.0)
Monocytes Absolute: 0.6 10*3/uL (ref 0.1–1.0)
Monocytes Relative: 6 %
Neutro Abs: 6.6 10*3/uL (ref 1.7–7.7)
Neutrophils Relative %: 66 %
Platelets: 279 10*3/uL (ref 150–400)
RBC: 4.58 MIL/uL (ref 4.22–5.81)
RDW: 13.9 % (ref 11.5–15.5)
WBC: 10.1 10*3/uL (ref 4.0–10.5)
nRBC: 0 % (ref 0.0–0.2)

## 2022-10-15 LAB — BASIC METABOLIC PANEL
Anion gap: 8 (ref 5–15)
BUN: 12 mg/dL (ref 6–20)
CO2: 22 mmol/L (ref 22–32)
Calcium: 8.8 mg/dL — ABNORMAL LOW (ref 8.9–10.3)
Chloride: 106 mmol/L (ref 98–111)
Creatinine, Ser: 1.09 mg/dL (ref 0.61–1.24)
GFR, Estimated: >60 mL/min (ref >60)
Glucose, Bld: 100 mg/dL — ABNORMAL HIGH (ref 70–99)
Potassium: 3.6 mmol/L (ref 3.5–5.1)
Sodium: 136 mmol/L (ref 135–145)

## 2022-10-15 MED ORDER — METHOCARBAMOL 500 MG PO TABS: 500.0000 mg | ORAL_TABLET | Freq: Two times a day (BID) | ORAL | 0 refills | Status: AC

## 2022-10-15 MED ORDER — IOHEXOL 300 MG/ML  SOLN
100.0000 mL | Freq: Once | INTRAMUSCULAR | Status: AC | PRN
  Administered 2022-10-15: 100 mL via INTRAVENOUS

## 2022-10-15 MED ORDER — LIDOCAINE 5 % EX PTCH: 1.0000 | MEDICATED_PATCH | CUTANEOUS | 0 refills | Status: AC

## 2022-10-15 MED ORDER — TETANUS-DIPHTH-ACELL PERTUSSIS 5-2.5-18.5 LF-MCG/0.5 IM SUSY
0.5000 mL | PREFILLED_SYRINGE | Freq: Once | INTRAMUSCULAR | Status: AC
  Administered 2022-10-15: 0.5 mL via INTRAMUSCULAR
  Filled 2022-10-15: qty 0.5

## 2022-10-15 MED ORDER — DICLOFENAC SODIUM ER 100 MG PO TB24: 100.0000 mg | ORAL_TABLET | Freq: Every day | ORAL | 0 refills | Status: AC

## 2022-10-15 MED ORDER — MUPIROCIN CALCIUM 2 % EX CREA: 1.0000 | TOPICAL_CREAM | Freq: Two times a day (BID) | CUTANEOUS | 0 refills | Status: AC
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
## 2022-10-15 NOTE — ED Notes (Signed)
Abrasions on right knee and left upper arm cleaned and dressed
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
